# Patient Record
Sex: Female | Born: 2000
Health system: Southern US, Community
[De-identification: ages and names within clinical notes are randomized; demographics above are authoritative.]

---
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
## 2022-06-08 ENCOUNTER — Other Ambulatory Visit (HOSPITAL_BASED_OUTPATIENT_CLINIC_OR_DEPARTMENT_OTHER): Payer: Self-pay

## 2022-06-08 DIAGNOSIS — R450 Nervousness: Secondary | ICD-10-CM | POA: Diagnosis not present

## 2022-06-08 DIAGNOSIS — Z1331 Encounter for screening for depression: Secondary | ICD-10-CM | POA: Diagnosis not present

## 2022-06-08 DIAGNOSIS — F419 Anxiety disorder, unspecified: Secondary | ICD-10-CM | POA: Diagnosis not present

## 2022-06-08 MED ORDER — SERTRALINE HCL 50 MG PO TABS
50.0000 mg | ORAL_TABLET | Freq: Every day | ORAL | 2 refills | Status: AC
  Filled 2022-06-08: qty 30, 30d supply, fill #0

## 2022-06-28 DIAGNOSIS — H5213 Myopia, bilateral: Secondary | ICD-10-CM | POA: Diagnosis not present

## 2022-10-31 DIAGNOSIS — R450 Nervousness: Secondary | ICD-10-CM | POA: Diagnosis not present

## 2022-10-31 DIAGNOSIS — F419 Anxiety disorder, unspecified: Secondary | ICD-10-CM | POA: Diagnosis not present

## 2022-10-31 DIAGNOSIS — R4582 Worries: Secondary | ICD-10-CM | POA: Diagnosis not present

## 2022-11-05 DIAGNOSIS — J019 Acute sinusitis, unspecified: Secondary | ICD-10-CM | POA: Diagnosis not present

## 2022-11-28 DIAGNOSIS — J209 Acute bronchitis, unspecified: Secondary | ICD-10-CM | POA: Diagnosis not present

## 2023-01-14 ENCOUNTER — Ambulatory Visit (HOSPITAL_BASED_OUTPATIENT_CLINIC_OR_DEPARTMENT_OTHER)
Admission: EM | Admit: 2023-01-14 | Discharge: 2023-01-14 | Disposition: A | Discharge status: Home / Self Care | Payer: Commercial Managed Care - PPO | Attending: Internal Medicine | Admitting: Internal Medicine

## 2023-01-14 ENCOUNTER — Encounter (HOSPITAL_BASED_OUTPATIENT_CLINIC_OR_DEPARTMENT_OTHER): Payer: Self-pay | Admitting: Emergency Medicine

## 2023-01-14 ENCOUNTER — Other Ambulatory Visit (HOSPITAL_BASED_OUTPATIENT_CLINIC_OR_DEPARTMENT_OTHER): Payer: Self-pay

## 2023-01-14 DIAGNOSIS — J101 Influenza due to other identified influenza virus with other respiratory manifestations: Secondary | ICD-10-CM

## 2023-01-14 LAB — POC COVID19/FLU A&B COMBO
Covid Antigen, POC: NEGATIVE
Influenza A Antigen, POC: POSITIVE — AB
Influenza B Antigen, POC: NEGATIVE

## 2023-01-14 MED ORDER — OSELTAMIVIR PHOSPHATE 75 MG PO CAPS
75.0000 mg | ORAL_CAPSULE | Freq: Two times a day (BID) | ORAL | 0 refills | Status: AC
  Filled 2023-01-14: qty 10, 5d supply, fill #0

## 2023-01-14 MED ORDER — OSELTAMIVIR PHOSPHATE 75 MG PO CAPS: 75.0000 mg | ORAL_CAPSULE | Freq: Two times a day (BID) | ORAL | 0 refills | Status: AC

## 2023-01-14 NOTE — Discharge Instructions (Addendum)
The counter medicines for symptom relief such as ibuprofen and acetaminophen for fever aches and pains, Robitussin for cough, Mucinex for congestion.  Rest, increase fluids, follow-up as needed

## 2023-01-14 NOTE — ED Provider Notes (Signed)
[facility]    CSN: [id number] Arrival date & time: 01/14/23  1509      History   Chief Complaint Chief Complaint  Patient presents with   Fever   Cough    HPI RAEONNA GOODLOW is a 22 y.o. female.    Fever Associated symptoms: congestion, cough, nausea, rhinorrhea and sore throat   Associated symptoms: no chest pain, no chills, no diarrhea and no vomiting   Cough Associated symptoms: fever, rhinorrhea and sore throat   Associated symptoms: no chest pain and no chills   Not feeling well for 2 days symptoms include fever to 102, body aches, fatigue, sore throat, rhinorrhea, nasal congestion, cough.  Admits nausea.  Admits headache.  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  Denies recent travel. Denies chest pain, shortness of breath, vomiting, diarrhea  History reviewed. No pertinent past medical history.  Patient Active Problem List   Diagnosis Date Noted   Pain of left patellofemoral joint 08/18/2016   Exercise induced bronchospasm 08/18/2016   Vulvodynia 03/17/2016    History reviewed. No pertinent surgical history.  OB History   No obstetric history on file.      Home Medications    Prior to Admission medications   Medication Sig Start Date End Date Taking? Authorizing Provider  fexofenadine (ALLEGRA ALLERGY) 180 MG tablet Take 1 tablet (180 mg total) by mouth daily for 15 days. 07/12/21 07/27/21  Trevor Iha, FNP  ibuprofen (ADVIL,MOTRIN) 200 MG tablet Take 200 mg by mouth every 6 (six) hours as needed for moderate pain.    [provider]  levonorgestrel-ethinyl estradiol (SRONYX) 0.1-20 MG-MCG tablet Take 1 tablet by mouth daily 06/16/21     predniSONE (DELTASONE) 20 MG tablet Take 3 tablets by mouth daily for 5 days. 07/12/21   Trevor Iha, FNP  sertraline (ZOLOFT) 50 MG tablet Take 1 tablet by mouth once a day. 06/16/21     sertraline (ZOLOFT) 50 MG tablet Take 1 tablet (50 mg total) by mouth  daily. 06/08/22  Yes     Family History Family History  Problem Relation Age of Onset   Hyperlipidemia Maternal Grandfather    Hypertension Other    Diabetes Paternal Grandfather     Social History Social History   Tobacco Use   Smoking status: Never   Smokeless tobacco: Never  Substance Use Topics   Alcohol use: No   Drug use: No     Allergies   Patient has no known allergies.   Review of Systems Review of Systems  Constitutional:  Positive for fatigue and fever. Negative for appetite change and chills.  HENT:  Positive for congestion, rhinorrhea and sore throat.   Respiratory:  Positive for cough.   Cardiovascular:  Negative for chest pain.  Gastrointestinal:  Positive for nausea. Negative for diarrhea and vomiting.     Physical Exam Triage Vital Signs ED Triage Vitals  Encounter Vitals Group     BP 01/14/23 1529 97/71     Systolic BP Percentile --      Diastolic BP Percentile --      Pulse Rate 01/14/23 1529 84     Resp 01/14/23 1529 18     Temp 01/14/23 1529 98.4 F (36.9 C)     Temp Source 01/14/23 1529 Oral     SpO2 01/14/23 1529 96 %     Weight --      Height --      Head  Circumference --      Peak Flow --      Pain Score 01/14/23 1527 0     Pain Loc --      Pain Education --      Exclude from Growth Chart --    No data found.  Updated Vital Signs BP 97/71 (BP Location: Right Arm)   Pulse 84   Temp 98.4 F (36.9 C) (Oral)   Resp 18   LMP  (LMP Unknown)   SpO2 96%   Visual Acuity Right Eye Distance:   Left Eye Distance:   Bilateral Distance:    Right Eye Near:   Left Eye Near:    Bilateral Near:     Physical Exam Vitals and nursing note reviewed.  Constitutional:      Appearance: She is not ill-appearing.  HENT:     Head: Normocephalic.     Left Ear: Tympanic membrane and ear canal normal.     Nose: Congestion present. No rhinorrhea.     Mouth/Throat:     Mouth: Mucous membranes are moist.     Pharynx: Oropharynx is clear.  No oropharyngeal exudate or posterior oropharyngeal erythema.  Eyes:     Conjunctiva/sclera: Conjunctivae normal.  Cardiovascular:     Rate and Rhythm: Normal rate.     Heart sounds: Normal heart sounds.  Pulmonary:     Effort: Pulmonary effort is normal.     Breath sounds: Normal breath sounds.  Musculoskeletal:     Cervical back: Neck supple.  Lymphadenopathy:     Cervical: No cervical adenopathy.  Neurological:     Mental Status: She is alert.      UC Treatments / Results  Labs (all labs ordered are listed, but only abnormal results are displayed) Labs Reviewed - No data to display  EKG   Radiology No results found.  Procedures Procedures (including critical care time)  Medications Ordered in UC Medications - No data to display  Initial Impression / Assessment and Plan / UC Course  I have reviewed the triage vital signs and the nursing notes.  Point-of-care strep is negative, point-of-care flu positive for influenza A     22 year old with flulike symptoms since yesterday, reports fever, congestions, body aches, fatigue.  She is well-appearing, vital signs are stable.  Is positive for influenza A, will prescribe Tamiflu, home care and follow-up reviewed with patient Final Clinical Impressions(s) / UC Diagnoses   Final diagnoses:  None   Discharge Instructions   None    ED Prescriptions   None    PDMP not reviewed this encounter.   Meliton Rattan, [place] 01/14/23 [phone]

## 2023-01-14 NOTE — ED Triage Notes (Signed)
Pt c/o cough, fever, body aches, congestion, headache,  and sore throat x 2 days.

## 2023-08-14 DIAGNOSIS — H5213 Myopia, bilateral: Secondary | ICD-10-CM | POA: Diagnosis not present

## 2023-09-19 DIAGNOSIS — M546 Pain in thoracic spine: Secondary | ICD-10-CM | POA: Diagnosis not present

## 2023-09-19 DIAGNOSIS — M6281 Muscle weakness (generalized): Secondary | ICD-10-CM | POA: Diagnosis not present

## 2023-09-19 DIAGNOSIS — G8929 Other chronic pain: Secondary | ICD-10-CM | POA: Diagnosis not present

## 2023-10-05 DIAGNOSIS — M6281 Muscle weakness (generalized): Secondary | ICD-10-CM | POA: Diagnosis not present

## 2023-10-05 DIAGNOSIS — M546 Pain in thoracic spine: Secondary | ICD-10-CM | POA: Diagnosis not present

## 2023-10-05 DIAGNOSIS — G8929 Other chronic pain: Secondary | ICD-10-CM | POA: Diagnosis not present

## 2023-10-12 DIAGNOSIS — G8929 Other chronic pain: Secondary | ICD-10-CM | POA: Diagnosis not present

## 2023-10-12 DIAGNOSIS — M546 Pain in thoracic spine: Secondary | ICD-10-CM | POA: Diagnosis not present

## 2023-10-12 DIAGNOSIS — M6281 Muscle weakness (generalized): Secondary | ICD-10-CM | POA: Diagnosis not present
